# Patient Record
Sex: Male | Born: 1970 | Race: White | Hispanic: No
Health system: Southern US, Community
[De-identification: ages and names within clinical notes are randomized; demographics above are authoritative.]

---

## 1999-09-11 ENCOUNTER — Emergency Department (HOSPITAL_COMMUNITY): Admission: EM | Admit: 1999-09-11 | Discharge: 1999-09-11 | Payer: Self-pay | Admitting: Emergency Medicine

## 1999-09-11 ENCOUNTER — Encounter: Payer: Self-pay | Admitting: Emergency Medicine

## 1999-09-24 ENCOUNTER — Encounter: Payer: Self-pay | Admitting: Emergency Medicine

## 1999-09-24 ENCOUNTER — Emergency Department (HOSPITAL_COMMUNITY): Admission: EM | Admit: 1999-09-24 | Discharge: 1999-09-24 | Payer: Self-pay | Admitting: Emergency Medicine

## 2001-05-11 ENCOUNTER — Emergency Department (HOSPITAL_COMMUNITY): Admission: EM | Admit: 2001-05-11 | Discharge: 2001-05-11 | Payer: Self-pay | Admitting: Emergency Medicine

## 2002-09-30 ENCOUNTER — Emergency Department (HOSPITAL_COMMUNITY): Admission: EM | Admit: 2002-09-30 | Discharge: 2002-09-30 | Payer: Self-pay | Admitting: Emergency Medicine

## 2002-10-14 ENCOUNTER — Emergency Department (HOSPITAL_COMMUNITY): Admission: EM | Admit: 2002-10-14 | Discharge: 2002-10-14 | Payer: Self-pay | Admitting: Emergency Medicine

## 2002-10-20 ENCOUNTER — Emergency Department (HOSPITAL_COMMUNITY): Admission: EM | Admit: 2002-10-20 | Discharge: 2002-10-21 | Payer: Self-pay | Admitting: Emergency Medicine

## 2002-10-21 ENCOUNTER — Encounter: Payer: Self-pay | Admitting: Emergency Medicine

## 2002-12-14 ENCOUNTER — Emergency Department (HOSPITAL_COMMUNITY): Admission: EM | Admit: 2002-12-14 | Discharge: 2002-12-15 | Payer: Self-pay | Admitting: Emergency Medicine

## 2002-12-20 ENCOUNTER — Encounter: Payer: Self-pay | Admitting: Emergency Medicine

## 2002-12-20 ENCOUNTER — Emergency Department (HOSPITAL_COMMUNITY): Admission: EM | Admit: 2002-12-20 | Discharge: 2002-12-20 | Payer: Self-pay | Admitting: Emergency Medicine

## 2003-08-01 ENCOUNTER — Emergency Department (HOSPITAL_COMMUNITY): Admission: EM | Admit: 2003-08-01 | Discharge: 2003-08-01 | Payer: Self-pay | Admitting: Emergency Medicine

## 2003-08-16 ENCOUNTER — Emergency Department (HOSPITAL_COMMUNITY): Admission: EM | Admit: 2003-08-16 | Discharge: 2003-08-16 | Payer: Self-pay | Admitting: Emergency Medicine

## 2005-10-19 ENCOUNTER — Encounter: Payer: Self-pay | Admitting: Emergency Medicine

## 2005-10-19 ENCOUNTER — Inpatient Hospital Stay (HOSPITAL_COMMUNITY): Admission: EM | Admit: 2005-10-19 | Discharge: 2005-10-20 | Payer: Self-pay | Admitting: Emergency Medicine

## 2005-11-25 ENCOUNTER — Emergency Department (HOSPITAL_COMMUNITY): Admission: EM | Admit: 2005-11-25 | Discharge: 2005-11-25 | Payer: Self-pay | Admitting: Emergency Medicine

## 2006-11-05 IMAGING — CR DG ABDOMEN ACUTE W/ 1V CHEST
3 series · 3 of 3 positions shown · non-contrast
Comparison: 08/01/03 and 10/21/02.

CLINICAL DATA: Abdominal pain with rectal bleeding.
 ACUTE ABDOMINAL SERIES WITH CHEST:

[w chest pa]
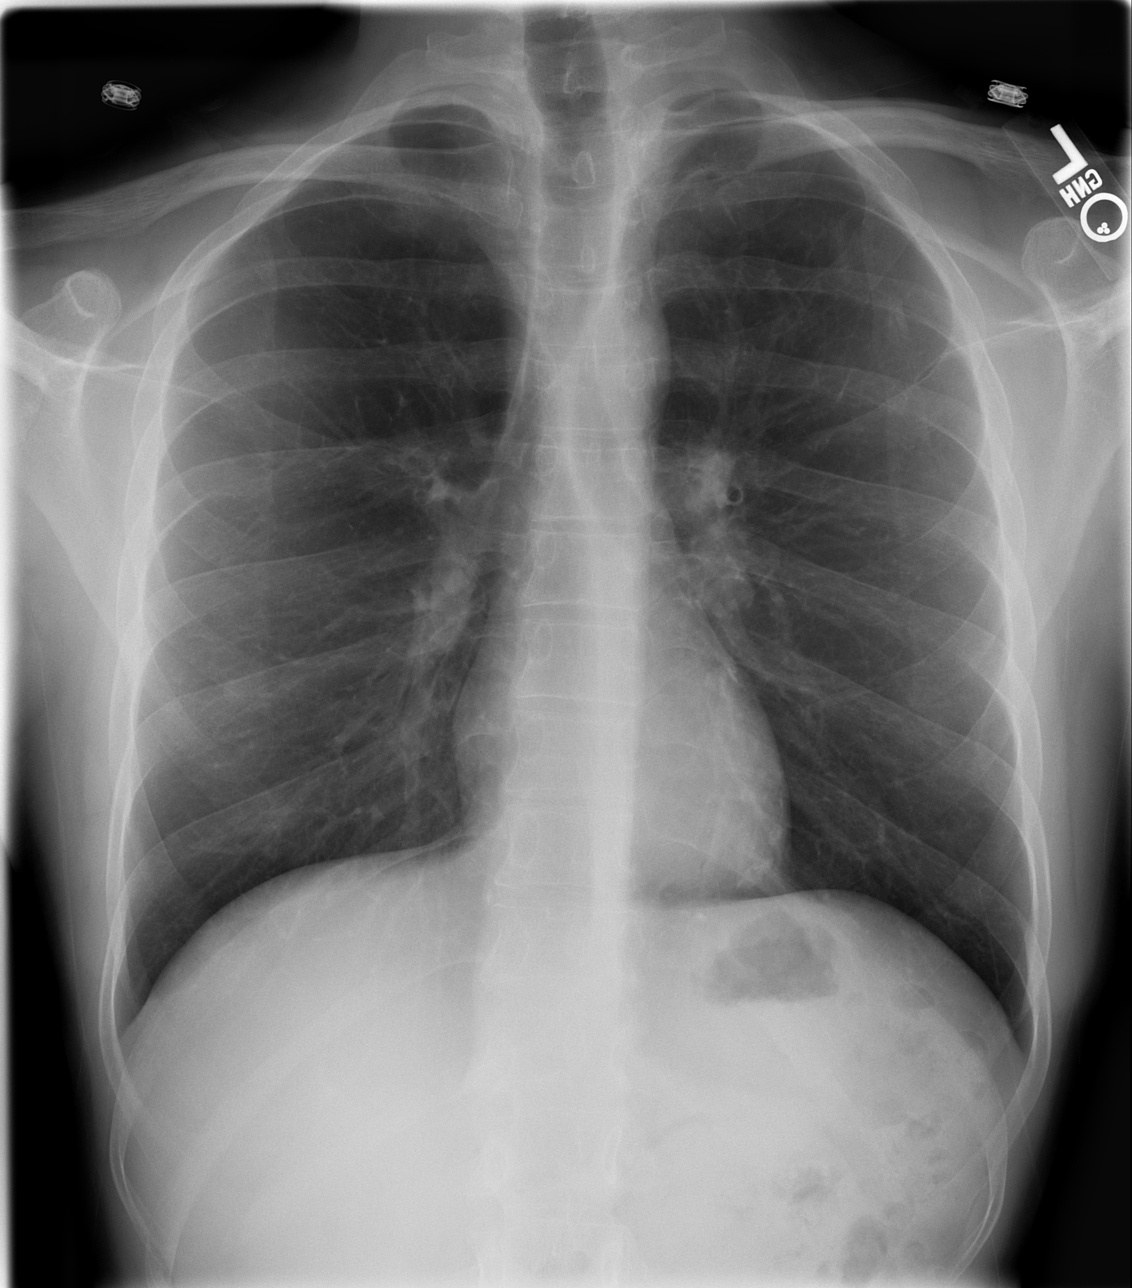

[w abdomen upright]
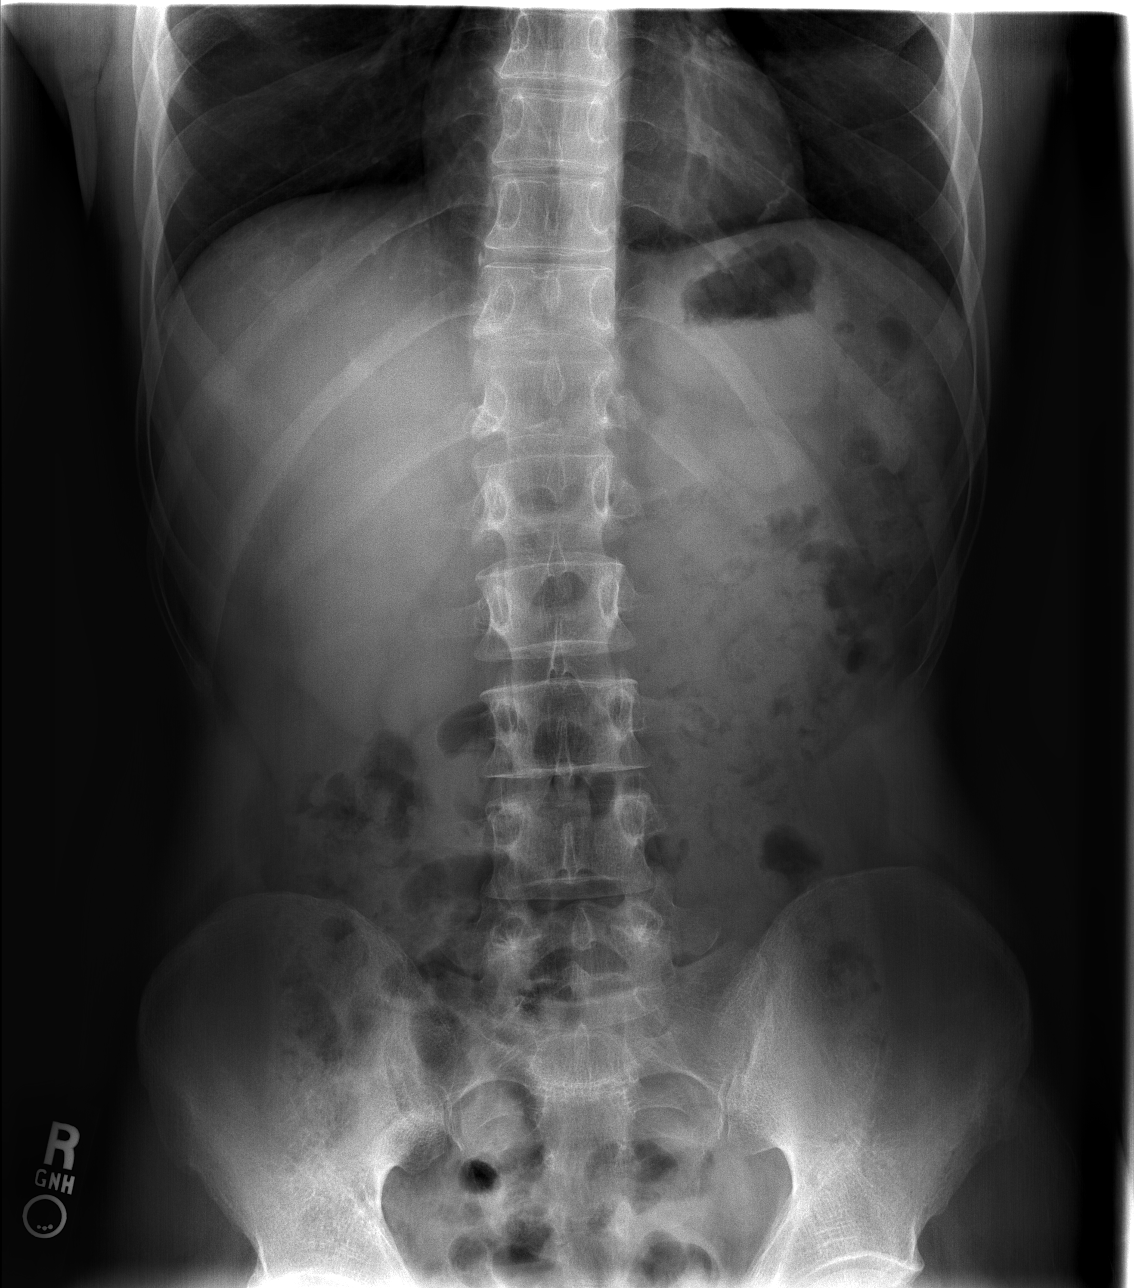

[t abdomen supine]
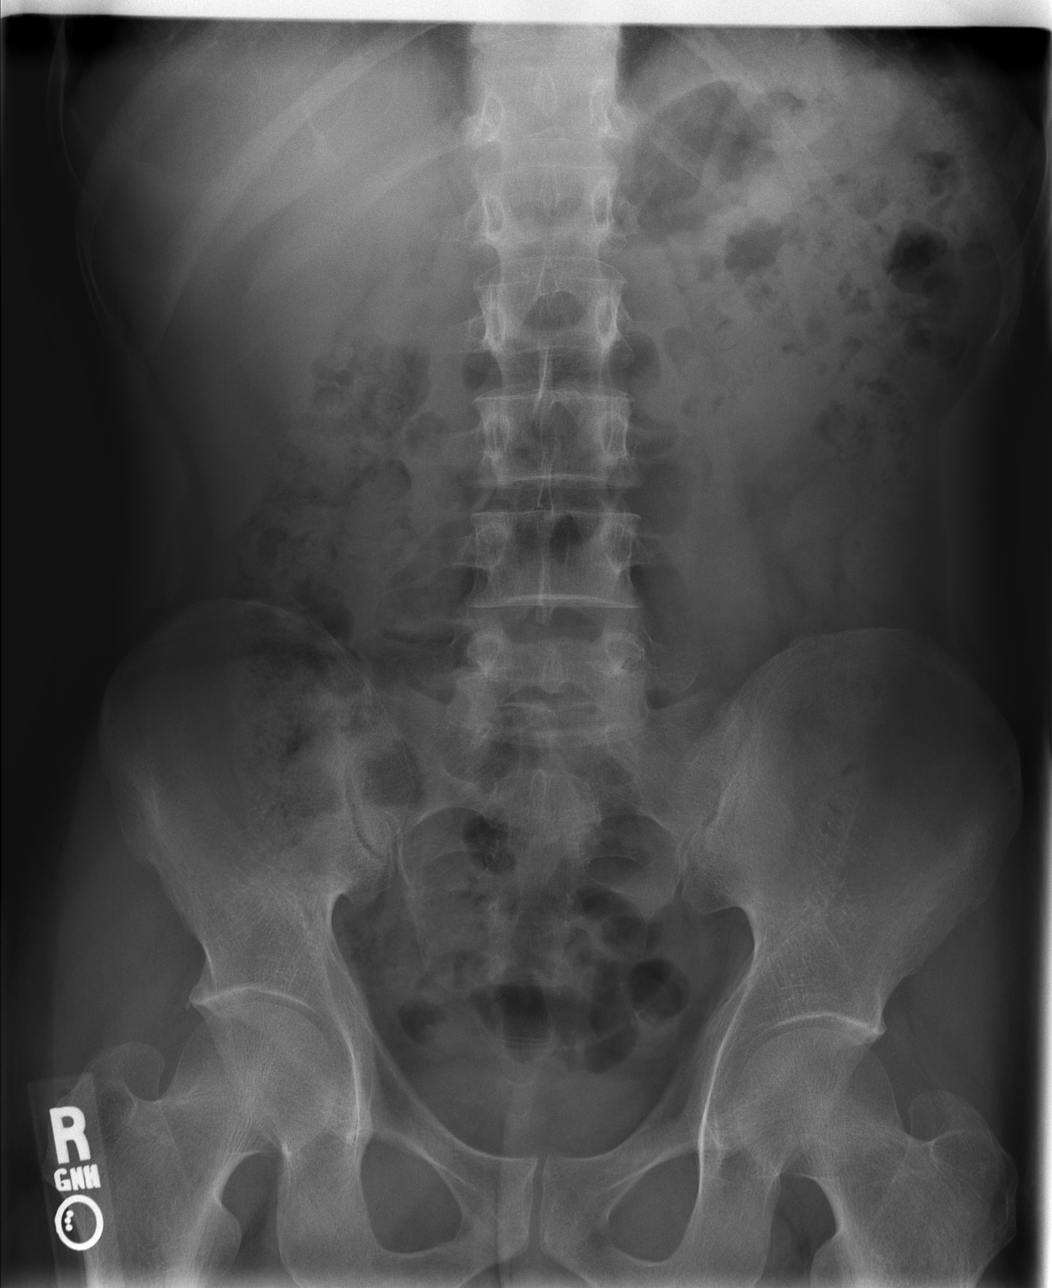

[3 of 3 positions shown; findings below may reference images not displayed]

FINDINGS: CHEST ? 1 VIEW:   
 The cardiomediastinal contours are stable.   The lungs appear mildly hyperinflated, but clear.   There is no pleural effusion or pneumothorax.
IMPRESSION: No active pulmonary process demonstrated. 
 ABDOMEN ? 2 VIEW:
 The bowel gas pattern is nonobstructive.   There is no free intraperitoneal air.   No suspicious calcifications are demonstrated.   The osseous structure appear unremarkable.
IMPRESSION: No active abdominal process demonstrated.

## 2017-07-27 DIAGNOSIS — L272 Dermatitis due to ingested food: Secondary | ICD-10-CM

## 2017-07-27 DIAGNOSIS — R079 Chest pain, unspecified: Secondary | ICD-10-CM

## 2017-07-27 DIAGNOSIS — Z720 Tobacco use: Secondary | ICD-10-CM

## 2017-07-27 DIAGNOSIS — I10 Essential (primary) hypertension: Secondary | ICD-10-CM

## 2019-06-27 DIAGNOSIS — K5732 Diverticulitis of large intestine without perforation or abscess without bleeding: Secondary | ICD-10-CM

## 2019-06-27 DIAGNOSIS — D72829 Elevated white blood cell count, unspecified: Secondary | ICD-10-CM

## 2019-06-27 DIAGNOSIS — I10 Essential (primary) hypertension: Secondary | ICD-10-CM

## 2019-06-27 DIAGNOSIS — R3129 Other microscopic hematuria: Secondary | ICD-10-CM

## 2022-01-25 ENCOUNTER — Other Ambulatory Visit: Payer: Self-pay

## 2022-01-25 ENCOUNTER — Emergency Department (HOSPITAL_COMMUNITY)
Admission: EM | Admit: 2022-01-25 | Discharge: 2022-01-25 | Disposition: A | Discharge status: Home / Self Care | Payer: Commercial Managed Care - HMO | Attending: Emergency Medicine | Admitting: Emergency Medicine

## 2022-01-25 ENCOUNTER — Emergency Department (HOSPITAL_COMMUNITY): Payer: Commercial Managed Care - HMO

## 2022-01-25 DIAGNOSIS — R079 Chest pain, unspecified: Secondary | ICD-10-CM | POA: Diagnosis not present

## 2022-01-25 DIAGNOSIS — K5790 Diverticulosis of intestine, part unspecified, without perforation or abscess without bleeding: Secondary | ICD-10-CM | POA: Insufficient documentation

## 2022-01-25 DIAGNOSIS — R1030 Lower abdominal pain, unspecified: Secondary | ICD-10-CM | POA: Diagnosis present

## 2022-01-25 DIAGNOSIS — K5792 Diverticulitis of intestine, part unspecified, without perforation or abscess without bleeding: Secondary | ICD-10-CM

## 2022-01-25 LAB — CBC WITH DIFFERENTIAL/PLATELET
Abs Immature Granulocytes: 0.12 10*3/uL — ABNORMAL HIGH (ref 0.00–0.07)
Basophils Absolute: 0.1 10*3/uL (ref 0.0–0.1)
Basophils Relative: 0 %
Eosinophils Absolute: 0 10*3/uL (ref 0.0–0.5)
Eosinophils Relative: 0 %
HCT: 42.3 % (ref 39.0–52.0)
Hemoglobin: 14.1 g/dL (ref 13.0–17.0)
Immature Granulocytes: 1 %
Lymphocytes Relative: 8 %
Lymphs Abs: 1.6 10*3/uL (ref 0.7–4.0)
MCH: 30.1 pg (ref 26.0–34.0)
MCHC: 33.3 g/dL (ref 30.0–36.0)
MCV: 90.2 fL (ref 80.0–100.0)
Monocytes Absolute: 2.1 10*3/uL — ABNORMAL HIGH (ref 0.1–1.0)
Monocytes Relative: 11 %
Neutro Abs: 15.1 10*3/uL — ABNORMAL HIGH (ref 1.7–7.7)
Neutrophils Relative %: 80 %
Platelets: 285 10*3/uL (ref 150–400)
RBC: 4.69 MIL/uL (ref 4.22–5.81)
RDW: 13.6 % (ref 11.5–15.5)
WBC: 19 10*3/uL — ABNORMAL HIGH (ref 4.0–10.5)
nRBC: 0 % (ref 0.0–0.2)

## 2022-01-25 LAB — COMPREHENSIVE METABOLIC PANEL
ALT: 8 U/L (ref 0–44)
AST: 13 U/L — ABNORMAL LOW (ref 15–41)
Albumin: 3.1 g/dL — ABNORMAL LOW (ref 3.5–5.0)
Alkaline Phosphatase: 67 U/L (ref 38–126)
Anion gap: 8 (ref 5–15)
BUN: 7 mg/dL (ref 6–20)
CO2: 24 mmol/L (ref 22–32)
Calcium: 8.4 mg/dL — ABNORMAL LOW (ref 8.9–10.3)
Chloride: 103 mmol/L (ref 98–111)
Creatinine, Ser: 0.86 mg/dL (ref 0.61–1.24)
GFR, Estimated: >60 mL/min (ref >60)
Glucose, Bld: 105 mg/dL — ABNORMAL HIGH (ref 70–99)
Potassium: 4.3 mmol/L (ref 3.5–5.1)
Sodium: 135 mmol/L (ref 135–145)
Total Bilirubin: 1.2 mg/dL (ref 0.3–1.2)
Total Protein: 6.2 g/dL — ABNORMAL LOW (ref 6.5–8.1)

## 2022-01-25 LAB — URINALYSIS, ROUTINE W REFLEX MICROSCOPIC
Bilirubin Urine: NEGATIVE
Glucose, UA: NEGATIVE mg/dL
Hgb urine dipstick: NEGATIVE
Ketones, ur: NEGATIVE mg/dL
Leukocytes,Ua: NEGATIVE
Nitrite: NEGATIVE
Protein, ur: NEGATIVE mg/dL
Specific Gravity, Urine: 1.002 — ABNORMAL LOW (ref 1.005–1.030)
pH: 6 (ref 5.0–8.0)

## 2022-01-25 LAB — TROPONIN I (HIGH SENSITIVITY)
Troponin I (High Sensitivity): 5 ng/L (ref <18)
Troponin I (High Sensitivity): 6 ng/L (ref <18)

## 2022-01-25 LAB — MAGNESIUM: Magnesium: 1.8 mg/dL (ref 1.7–2.4)

## 2022-01-25 LAB — LACTIC ACID, PLASMA: Lactic Acid, Venous: 1.8 mmol/L (ref 0.5–1.9)

## 2022-01-25 LAB — LIPASE, BLOOD: Lipase: 22 U/L (ref 11–51)

## 2022-01-25 MED ORDER — MORPHINE SULFATE (PF) 4 MG/ML IV SOLN
4.0000 mg | Freq: Once | INTRAVENOUS | Status: AC
  Administered 2022-01-25: 4 mg via INTRAVENOUS
  Filled 2022-01-25: qty 1

## 2022-01-25 MED ORDER — IOHEXOL 350 MG/ML SOLN: 100.0000 mL | Freq: Once | INTRAVENOUS | Status: DC | PRN

## 2022-01-25 MED ORDER — HYDROCODONE-ACETAMINOPHEN 5-325 MG PO TABS: 2.0000 | ORAL_TABLET | ORAL | 0 refills | Status: AC | PRN

## 2022-01-25 MED ORDER — ONDANSETRON 4 MG PO TBDP: 4.0000 mg | ORAL_TABLET | Freq: Three times a day (TID) | ORAL | 0 refills | Status: AC | PRN

## 2022-01-25 MED ORDER — IOHEXOL 350 MG/ML SOLN
100.0000 mL | Freq: Once | INTRAVENOUS | Status: AC | PRN
  Administered 2022-01-25: 100 mL via INTRAVENOUS

## 2022-01-25 MED ORDER — AMOXICILLIN-POT CLAVULANATE 875-125 MG PO TABS: 1.0000 | ORAL_TABLET | Freq: Two times a day (BID) | ORAL | 0 refills | Status: AC

## 2022-01-25 NOTE — ED Triage Notes (Signed)
Pt is arriving via [facility] coming from home. Pt initially was c/o abdominal pain on sight, however has been dealing with chest pain for the last 3 days. It initially felt like a 8/10 pain, but upon EMS arrival the pain was 3/10. 0.4 Nitro administered in the field.   EMS reported V1-V4 was elevated upon arrival.   was given 700 NS  LVS 120/70  84 P  98%  CBG 111

## 2022-01-25 NOTE — ED Provider Notes (Signed)
[facility] EMERGENCY DEPARTMENT Provider Note   CSN: [id number] Arrival date & time: 01/25/22  1137     History  Chief Complaint  Patient presents with   Chest Pain    Derrick Dyer is a 51 y.o. male.  HPI Patient presents for lower abdominal pain.  Medical history includes PUD.  He reports that he had some straining activity 3 days ago.  He was lifting heavy objects and has had subsequent pain in his lower abdominal area.  Pain is worsened with movement, straining, and palpation.  He has had bowel movements and describes them as nonbloody diarrhea.  He denies any discomfort while urinating.  He denies any history of similar symptoms.  He has had a history of PUD and he states that they did do surgery on his stomach.  This was approximately 10 years ago.  Patient denies any recent nausea.  He denies any chest discomfort or shortness of breath.    Home Medications Prior to Admission medications   Medication Sig Start Date End Date Taking? Authorizing Provider  amoxicillin-clavulanate (AUGMENTIN) 875-125 MG tablet Take 1 tablet by mouth every 12 (twelve) hours. 01/25/22  Yes Gloris Manchester, MD  HYDROcodone-acetaminophen (NORCO/VICODIN) 5-325 MG tablet Take 2 tablets by mouth every 4 (four) hours as needed. 01/25/22  Yes Gloris Manchester, MD  ondansetron (ZOFRAN-ODT) 4 MG disintegrating tablet Take 1 tablet (4 mg total) by mouth every 8 (eight) hours as needed for nausea or vomiting. 01/25/22  Yes Gloris Manchester, MD      Allergies    Patient has no known allergies.    Review of Systems   Review of Systems  Gastrointestinal:  Positive for abdominal pain and diarrhea.  All other systems reviewed and are negative.   Physical Exam Updated Vital Signs BP (!) 125/97   Pulse 78   Temp 98 F (36.7 C) (Oral)   Resp 17   SpO2 99%  Physical Exam Vitals and nursing note reviewed.  Constitutional:      General: He is not in acute distress.    Appearance: Normal appearance. He is  well-developed and normal weight. He is not ill-appearing, toxic-appearing or diaphoretic.  HENT:     Head: Normocephalic and atraumatic.     Right Ear: External ear normal.     Left Ear: External ear normal.     Nose: Nose normal.     Mouth/Throat:     Mouth: Mucous membranes are moist.     Pharynx: Oropharynx is clear.  Eyes:     Extraocular Movements: Extraocular movements intact.     Conjunctiva/sclera: Conjunctivae normal.  Cardiovascular:     Rate and Rhythm: Normal rate and regular rhythm.     Heart sounds: No murmur heard. Pulmonary:     Effort: Pulmonary effort is normal. No respiratory distress.     Breath sounds: Normal breath sounds. No wheezing or rales.  Chest:     Chest wall: No tenderness.  Abdominal:     General: Abdomen is flat. There is no distension.     Palpations: Abdomen is soft.     Tenderness: There is abdominal tenderness (Lower abdomen). There is no guarding or rebound.  Musculoskeletal:        General: No swelling. Normal range of motion.     Cervical back: Normal range of motion and neck supple.     Right lower leg: No edema.     Left lower leg: No edema.  Skin:    General:  Skin is warm and dry.     Capillary Refill: Capillary refill takes less than 2 seconds.     Coloration: Skin is not jaundiced or pale.  Neurological:     General: No focal deficit present.     Mental Status: He is alert and oriented to person, place, and time.     Cranial Nerves: No cranial nerve deficit.     Sensory: No sensory deficit.     Motor: No weakness.     Coordination: Coordination normal.  Psychiatric:        Mood and Affect: Mood normal.        Behavior: Behavior normal.        Thought Content: Thought content normal.        Judgment: Judgment normal.     ED Results / Procedures / Treatments   Labs (all labs ordered are listed, but only abnormal results are displayed) Labs Reviewed  COMPREHENSIVE METABOLIC PANEL - Abnormal; Notable for the following  components:      Result Value   Glucose, Bld 105 (*)    Calcium 8.4 (*)    Total Protein 6.2 (*)    Albumin 3.1 (*)    AST 13 (*)    All other components within normal limits  CBC WITH DIFFERENTIAL/PLATELET - Abnormal; Notable for the following components:   WBC 19.0 (*)    Neutro Abs 15.1 (*)    Monocytes Absolute 2.1 (*)    Abs Immature Granulocytes 0.12 (*)    All other components within normal limits  URINALYSIS, ROUTINE W REFLEX MICROSCOPIC - Abnormal; Notable for the following components:   Color, Urine STRAW (*)    Specific Gravity, Urine 1.002 (*)    All other components within normal limits  LIPASE, BLOOD  MAGNESIUM  LACTIC ACID, PLASMA  LACTIC ACID, PLASMA  TROPONIN I (HIGH SENSITIVITY)  TROPONIN I (HIGH SENSITIVITY)    EKG EKG Interpretation  Date/Time:  Monday January 25 2022 11:41:39 EDT Ventricular Rate:  76 PR Interval:  126 QRS Duration: 123 QT Interval:  393 QTC Calculation: 442 R Axis:   3 Text Interpretation: Sinus rhythm Left bundle branch block Confirmed by Gloris Manchester ([id number]) on 01/25/2022 11:46:06 AM  Radiology CT Angio Chest/Abd/Pel for Dissection W and/or Wo Contrast  Result Date: 01/25/2022 CLINICAL DATA:  Chest and abdominal pain, acute aortic syndrome suspected. EXAM: CT ANGIOGRAPHY CHEST, ABDOMEN AND PELVIS TECHNIQUE: Non-contrast CT of the chest was initially obtained. Multidetector CT imaging through the chest, abdomen and pelvis was performed using the standard protocol during bolus administration of intravenous contrast. Multiplanar reconstructed images and MIPs were obtained and reviewed to evaluate the vascular anatomy. RADIATION DOSE REDUCTION: This exam was performed according to the departmental dose-optimization program which includes automated exposure control, adjustment of the mA and/or kV according to patient size and/or use of iterative reconstruction technique. CONTRAST:  OMNIPAQUE IOHEXOL 350 MG/ML SOLN COMPARISON:  CT June 26, 2019. FINDINGS: CTA CHEST FINDINGS Cardiovascular: Non contrasted series demonstrates minimal calcified aortic atherosclerosis without intramural hematoma. Preferential opacification of the thoracic aorta postcontrast administration without evidence of thoracic aortic aneurysm or dissection. No central pulmonary embolus on this nondedicated study. Normal heart size. No pericardial effusion. Coronary artery calcifications. Mediastinum/Nodes: No suspicious thyroid nodule. Calcified left hilar lymph nodes. No pathologically enlarged mediastinal, hilar or axillary lymph nodes. Diffuse esophageal wall thickening. Lungs/Pleura: 4 mm left upper lobe pulmonary nodule on image 64/9. No pleural effusion. No pneumothorax. Musculoskeletal: No aggressive lytic or blastic  lesion of bone. Mild anterior wedging of the T11 vertebral body is stable dating back to June 26, 2019. Review of the MIP images confirms the above findings. CTA ABDOMEN AND PELVIS FINDINGS VASCULAR Aorta: Normal caliber aorta without aneurysm, dissection, vasculitis or significant stenosis. 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 SMA: Patent without evidence of aneurysm, dissection, vasculitis or significant stenosis. Renals: Both renal arteries are patent without evidence of aneurysm, dissection, vasculitis, fibromuscular dysplasia or significant stenosis. IMA: Patent without evidence of aneurysm, dissection, vasculitis or significant stenosis. Inflow: Patent without evidence of aneurysm, dissection, vasculitis or significant stenosis. Veins: No obvious venous abnormality within the limitations of this arterial phase study. Review of the MIP images confirms the above findings. NON-VASCULAR Hepatobiliary: No suspicious hepatic lesion. Gallbladder is unremarkable. No biliary ductal dilation. Pancreas: No pancreatic ductal dilation or evidence of acute inflammation. Spleen: No  splenomegaly. Adrenals/Urinary Tract: Bilateral adrenal glands appear normal. Benign 11 mm left upper pole renal cyst requires no follow-up. No hydronephrosis. Urinary bladder is nondistended limiting evaluation. Stomach/Bowel: No radiopaque enteric contrast material was administered. Stomach is unremarkable for degree of distension. No pathologic dilation of small or large bowel. The appendix appears surgically absent. Colonic diverticulosis with sigmoid colonic diverticulitis. Lymphatic: No pathologically enlarged abdominal or pelvic lymph nodes. Reproductive: Prostate is unremarkable. Other: No walled off fluid collections.  No pneumoperitoneum. Musculoskeletal: Thoracolumbar spondylosis. Review of the MIP images confirms the above findings. IMPRESSION: 1. Acute uncomplicated sigmoid colonic diverticulitis. 2. Diffuse esophageal wall thickening is nonspecific possibly reflecting esophagitis. However, given the degree of thickening would consider further evaluation with GI consult and upper endoscopy. 3. 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 CTA recommended in 6-12 months to ensure continued stability. 4. No aortic aneurysm or dissection. 5. Solid 4 mm left upper lobe pulmonary nodule. Per Fleischner Society Guidelines, if patient is low risk for malignancy, no routine follow-up imaging is recommended; if patient is high risk for malignancy, a non-contrast Chest CT at 12 months is optional. If performed and the nodule is stable at 12 months, no further follow-up is recommended. These guidelines do not apply to immunocompromised patients and patients with cancer. Follow up in patients with significant comorbidities as clinically warranted. For lung cancer screening, adhere to Lung-RADS guidelines. Reference: Radiology. 2017; 284(1):228-43. 6.  Aortic Atherosclerosis (ICD10-I70.0). Electronically Signed   By: Maudry Mayhew M.D.   On: 01/25/2022 14:56     Procedures Procedures    Medications Ordered in ED Medications  iohexol (OMNIPAQUE) 350 MG/ML injection 100 mL (has no administration in time range)  morphine (PF) 4 MG/ML injection 4 mg (4 mg Intravenous Given 01/25/22 1235)  iohexol (OMNIPAQUE) 350 MG/ML injection 100 mL (100 mLs Intravenous Contrast Given 01/25/22 1437)    ED Course/ Medical Decision Making/ A&P                           Medical Decision Making Amount and/or Complexity of Data Reviewed Labs: ordered. Radiology: ordered.  Risk Prescription drug management.   This patient presents to the ED for concern of lower abdominal pain, this involves an extensive number of treatment options, and is a complaint that carries with it a high risk of complications and morbidity.  The differential diagnosis includes muscle strain, inguinal hernia, diverticulitis, constipation, appendicitis, bowel obstruction, cystitis   Co morbidities that complicate  the patient evaluation  PUD   Additional history obtained:  Additional history obtained from N/A External records from outside source obtained and reviewed including EMR   Lab Tests:  I Ordered, and personally interpreted labs.  The pertinent results include: Glucose ptosis is present.  Laboratory work-up otherwise unremarkable.   Imaging Studies ordered:  I ordered imaging studies including CT chest, abdomen, and pelvis dissection study I independently visualized and interpreted imaging which showed acute, uncomplicated diverticulitis I agree with the radiologist interpretation   Cardiac Monitoring: / EKG:  The patient was maintained on a cardiac monitor.  I personally viewed and interpreted the cardiac monitored which showed an underlying rhythm of: Sinus rhythm  Problem List / ED Course / Critical interventions / Medication management  Patient is a 51 year old male presenting for 3 days of ongoing lower abdominal pain.  He denies any systemic symptoms,  including nausea or vomiting.  He does state that he has continued to make bowel movements which are described as nonbloody diarrhea.  On exam, he does have diffuse tenderness throughout his lower abdomen.  Patient was given morphine for analgesia.  Laboratory work-up was initiated.  Laboratory work-up was notable for leukocytosis of 19.  He underwent CT imaging which did show acute uncomplicated diverticulitis.  He was informed of this.  Following the morphine, patient reports resolution of his pain.  Given the uncomplicated nature of his diverticulitis and his resolution of symptoms, patient is stable for discharge home.  He was prescribed Augmentin, Norco, and Zofran.  He was counseled on possible complications that may arise from his diverticulitis and he was advised to return to the ED if he does experience any worsening of symptoms.  Patient was discharged in stable condition. I ordered medication including morphine for analgesia Reevaluation of the patient after these medicines showed that the patient resolved I have reviewed the patients home medicines and have made adjustments as needed   Social Determinants of Health:  Does not have PCP         Final Clinical Impression(s) / ED Diagnoses Final diagnoses:  Diverticulitis    Rx / DC Orders ED Discharge Orders          Ordered    amoxicillin-clavulanate (AUGMENTIN) 875-125 MG tablet  Every 12 hours        01/25/22 1604    HYDROcodone-acetaminophen (NORCO/VICODIN) 5-325 MG tablet  Every 4 hours PRN        01/25/22 1604    ondansetron (ZOFRAN-ODT) 4 MG disintegrating tablet  Every 8 hours PRN        01/25/22 1604              Gloris Manchester, MD 01/25/22 1620

## 2022-01-25 NOTE — ED Notes (Signed)
All discharge instructions including follow up care and prescriptions reviewed with patient and patient verbalized understanding of same. Patient stable and ambulatory at time of discharge.  

## 2022-01-25 NOTE — Discharge Instructions (Addendum)
You have diverticulitis.  This is an inflammation of outpouchings in your large intestine.  You can treat your pain with over-the-counter Tylenol and ibuprofen.  Additionally, the following prescriptions were sent to your pharmacy: -Augmentin is an antibiotic to prevent complications.  Take this as prescribed. -Norco is a narcotic pain medication.  Take this as needed.  Take with caution. -Zofran is a medication that treats nausea.  Take this only as needed.  Currently, your diverticulitis is uncomplicated.  Complications that can arise include perforation of your large intestine or abscess formation.  If you experience worsening symptoms (which could include worsening pain, fevers, chills, etc.), please return to the emergency department.

## 2022-01-25 NOTE — ED Notes (Signed)
Patient transported to CT
